# Patient Record
Sex: Female | Born: 1955 | Race: Black or African American | Hispanic: No | Marital: Married | Smoking: Never smoker
Health system: Southern US, Community
[De-identification: ages and names within clinical notes are randomized; demographics above are authoritative.]

## PROBLEM LIST (undated history)

## (undated) ENCOUNTER — Ambulatory Visit: Admission: EM | Payer: Medicare Other

## (undated) DIAGNOSIS — M1990 Unspecified osteoarthritis, unspecified site: Secondary | ICD-10-CM

## (undated) DIAGNOSIS — G459 Transient cerebral ischemic attack, unspecified: Secondary | ICD-10-CM

## (undated) DIAGNOSIS — I10 Essential (primary) hypertension: Secondary | ICD-10-CM

## (undated) HISTORY — PX: CHOLECYSTECTOMY: SHX55

---

## 2007-02-02 ENCOUNTER — Other Ambulatory Visit: Payer: Self-pay

## 2007-02-02 ENCOUNTER — Emergency Department: Payer: Self-pay | Admitting: Emergency Medicine

## 2012-06-21 ENCOUNTER — Emergency Department: Payer: Self-pay

## 2017-02-05 ENCOUNTER — Emergency Department: Payer: No Typology Code available for payment source

## 2017-02-05 ENCOUNTER — Encounter: Payer: Self-pay | Admitting: Emergency Medicine

## 2017-02-05 ENCOUNTER — Emergency Department
Admission: EM | Admit: 2017-02-05 | Discharge: 2017-02-05 | Disposition: A | Discharge status: Home / Self Care | Payer: No Typology Code available for payment source | Attending: Emergency Medicine | Admitting: Emergency Medicine

## 2017-02-05 DIAGNOSIS — M791 Myalgia: Secondary | ICD-10-CM | POA: Insufficient documentation

## 2017-02-05 DIAGNOSIS — Z79899 Other long term (current) drug therapy: Secondary | ICD-10-CM | POA: Insufficient documentation

## 2017-02-05 DIAGNOSIS — I10 Essential (primary) hypertension: Secondary | ICD-10-CM | POA: Insufficient documentation

## 2017-02-05 DIAGNOSIS — Z791 Long term (current) use of non-steroidal anti-inflammatories (NSAID): Secondary | ICD-10-CM | POA: Diagnosis not present

## 2017-02-05 DIAGNOSIS — M7918 Myalgia, other site: Secondary | ICD-10-CM

## 2017-02-05 DIAGNOSIS — Y9389 Activity, other specified: Secondary | ICD-10-CM | POA: Diagnosis not present

## 2017-02-05 DIAGNOSIS — Y92410 Unspecified street and highway as the place of occurrence of the external cause: Secondary | ICD-10-CM | POA: Insufficient documentation

## 2017-02-05 DIAGNOSIS — Y999 Unspecified external cause status: Secondary | ICD-10-CM | POA: Insufficient documentation

## 2017-02-05 HISTORY — DX: Unspecified osteoarthritis, unspecified site: M19.90

## 2017-02-05 HISTORY — DX: Essential (primary) hypertension: I10
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
## 2021-12-28 ENCOUNTER — Emergency Department
Admission: EM | Admit: 2021-12-28 | Discharge: 2021-12-28 | Disposition: A | Discharge status: Home / Self Care | Payer: Medicare Other | Attending: Emergency Medicine | Admitting: Emergency Medicine

## 2021-12-28 ENCOUNTER — Encounter: Payer: Self-pay | Admitting: Emergency Medicine

## 2021-12-28 ENCOUNTER — Emergency Department: Payer: Medicare Other

## 2021-12-28 ENCOUNTER — Other Ambulatory Visit: Payer: Self-pay

## 2021-12-28 DIAGNOSIS — M7918 Myalgia, other site: Secondary | ICD-10-CM

## 2021-12-28 DIAGNOSIS — I10 Essential (primary) hypertension: Secondary | ICD-10-CM | POA: Diagnosis not present

## 2021-12-28 DIAGNOSIS — M791 Myalgia, unspecified site: Secondary | ICD-10-CM | POA: Diagnosis not present

## 2021-12-28 DIAGNOSIS — M79642 Pain in left hand: Secondary | ICD-10-CM | POA: Insufficient documentation

## 2021-12-28 DIAGNOSIS — Y92410 Unspecified street and highway as the place of occurrence of the external cause: Secondary | ICD-10-CM | POA: Insufficient documentation
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
## 2023-10-11 ENCOUNTER — Emergency Department
Admission: EM | Admit: 2023-10-11 | Discharge: 2023-10-11 | Disposition: A | Discharge status: Against Medical Advice | Payer: Medicare Other | Attending: Emergency Medicine | Admitting: Emergency Medicine

## 2023-10-11 ENCOUNTER — Other Ambulatory Visit: Payer: Self-pay

## 2023-10-11 DIAGNOSIS — W010XXA Fall on same level from slipping, tripping and stumbling without subsequent striking against object, initial encounter: Secondary | ICD-10-CM | POA: Insufficient documentation

## 2023-10-11 DIAGNOSIS — Y92412 Parkway as the place of occurrence of the external cause: Secondary | ICD-10-CM | POA: Insufficient documentation

## 2023-10-11 DIAGNOSIS — S098XXA Other specified injuries of head, initial encounter: Secondary | ICD-10-CM | POA: Diagnosis present

## 2023-10-11 DIAGNOSIS — Y990 Civilian activity done for income or pay: Secondary | ICD-10-CM | POA: Diagnosis not present

## 2023-10-11 HISTORY — DX: Transient cerebral ischemic attack, unspecified: G45.9

## 2023-10-11 NOTE — ED Notes (Signed)
Family to 1st nurse stating they are taking their mom to UC. Pt can't wait any longer.

## 2023-10-11 NOTE — ED Triage Notes (Signed)
Pt to ED for fall while at work today at [employer]. Pt uses cane to walk and cane slipped while walking. Hit L forehead. No LOC. Alert, oriented, NAD. Ambulatory to triage room with cane. Pt declines wheelchair.
